# Patient Record
Sex: Female | Born: 1950 | Race: White | Hispanic: No | Smoking: Current every day smoker
Health system: Southern US, Community
[De-identification: ages and names within clinical notes are randomized; demographics above are authoritative.]

## PROBLEM LIST (undated history)

## (undated) DIAGNOSIS — I10 Essential (primary) hypertension: Secondary | ICD-10-CM

## (undated) DIAGNOSIS — I82409 Acute embolism and thrombosis of unspecified deep veins of unspecified lower extremity: Secondary | ICD-10-CM

## (undated) DIAGNOSIS — C801 Malignant (primary) neoplasm, unspecified: Secondary | ICD-10-CM

## (undated) DIAGNOSIS — E7800 Pure hypercholesterolemia, unspecified: Secondary | ICD-10-CM

## (undated) HISTORY — PX: ABDOMINAL HYSTERECTOMY: SHX81

## (undated) HISTORY — PX: TUBAL LIGATION: SHX77

## (undated) HISTORY — PX: OTHER SURGICAL HISTORY: SHX169

## (undated) HISTORY — PX: CYSTOSCOPY: SUR368

## (undated) HISTORY — PX: NECK SURGERY: SHX720

## (undated) HISTORY — PX: LOBECTOMY: SHX5089

## (undated) HISTORY — PX: HEMORRHOID SURGERY: SHX153

## (undated) HISTORY — PX: KNEE SURGERY: SHX244

---

## 2009-08-03 ENCOUNTER — Emergency Department (HOSPITAL_COMMUNITY): Admission: EM | Admit: 2009-08-03 | Discharge: 2009-08-03 | Payer: Self-pay | Admitting: Emergency Medicine

## 2010-10-05 LAB — DIFFERENTIAL
Basophils Absolute: 0.1 10*3/uL (ref 0.0–0.1)
Eosinophils Relative: 2 % (ref 0–5)
Lymphocytes Relative: 36 % (ref 12–46)
Lymphs Abs: 3.6 10*3/uL (ref 0.7–4.0)
Monocytes Absolute: 0.9 10*3/uL (ref 0.1–1.0)
Neutro Abs: 5.3 10*3/uL (ref 1.7–7.7)

## 2010-10-05 LAB — BASIC METABOLIC PANEL
BUN: 13 mg/dL (ref 6–23)
Calcium: 9 mg/dL (ref 8.4–10.5)
Creatinine, Ser: 1.03 mg/dL (ref 0.4–1.2)
GFR calc non Af Amer: 55 mL/min — ABNORMAL LOW (ref >60)
Glucose, Bld: 97 mg/dL (ref 70–99)
Potassium: 3.7 mEq/L (ref 3.5–5.1)
Sodium: 137 mEq/L (ref 135–145)

## 2010-10-05 LAB — CBC
Hemoglobin: 14.3 g/dL (ref 12.0–15.0)
MCHC: 33.8 g/dL (ref 30.0–36.0)
RBC: 4.07 MIL/uL (ref 3.87–5.11)
RDW: 12.5 % (ref 11.5–15.5)
WBC: 10 10*3/uL (ref 4.0–10.5)

## 2010-11-04 IMAGING — CR DG CHEST 2V
2 series · 2 of 2 positions shown · non-contrast
Comparison: None.

CLINICAL DATA: Cough.  Left-sided chest pain.

CHEST - 2 VIEW

[view not recorded (1 of 2)]
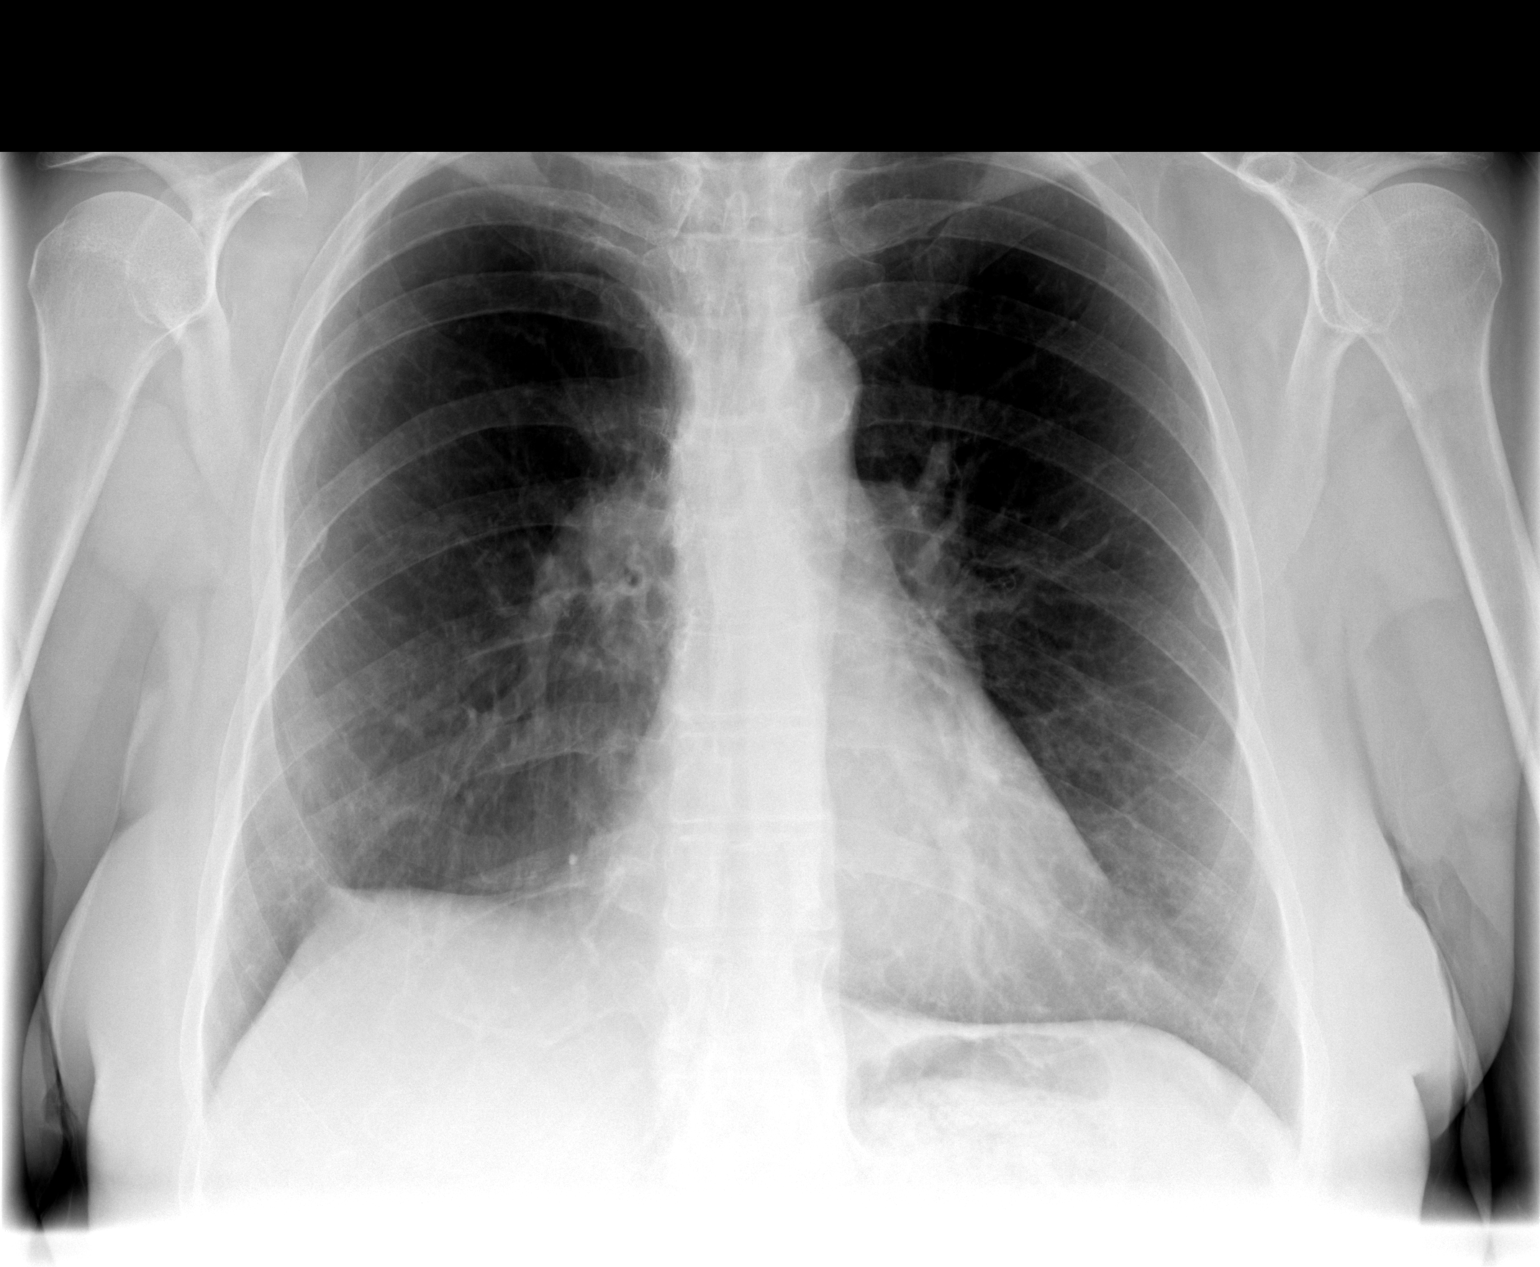

[view not recorded (2 of 2)]
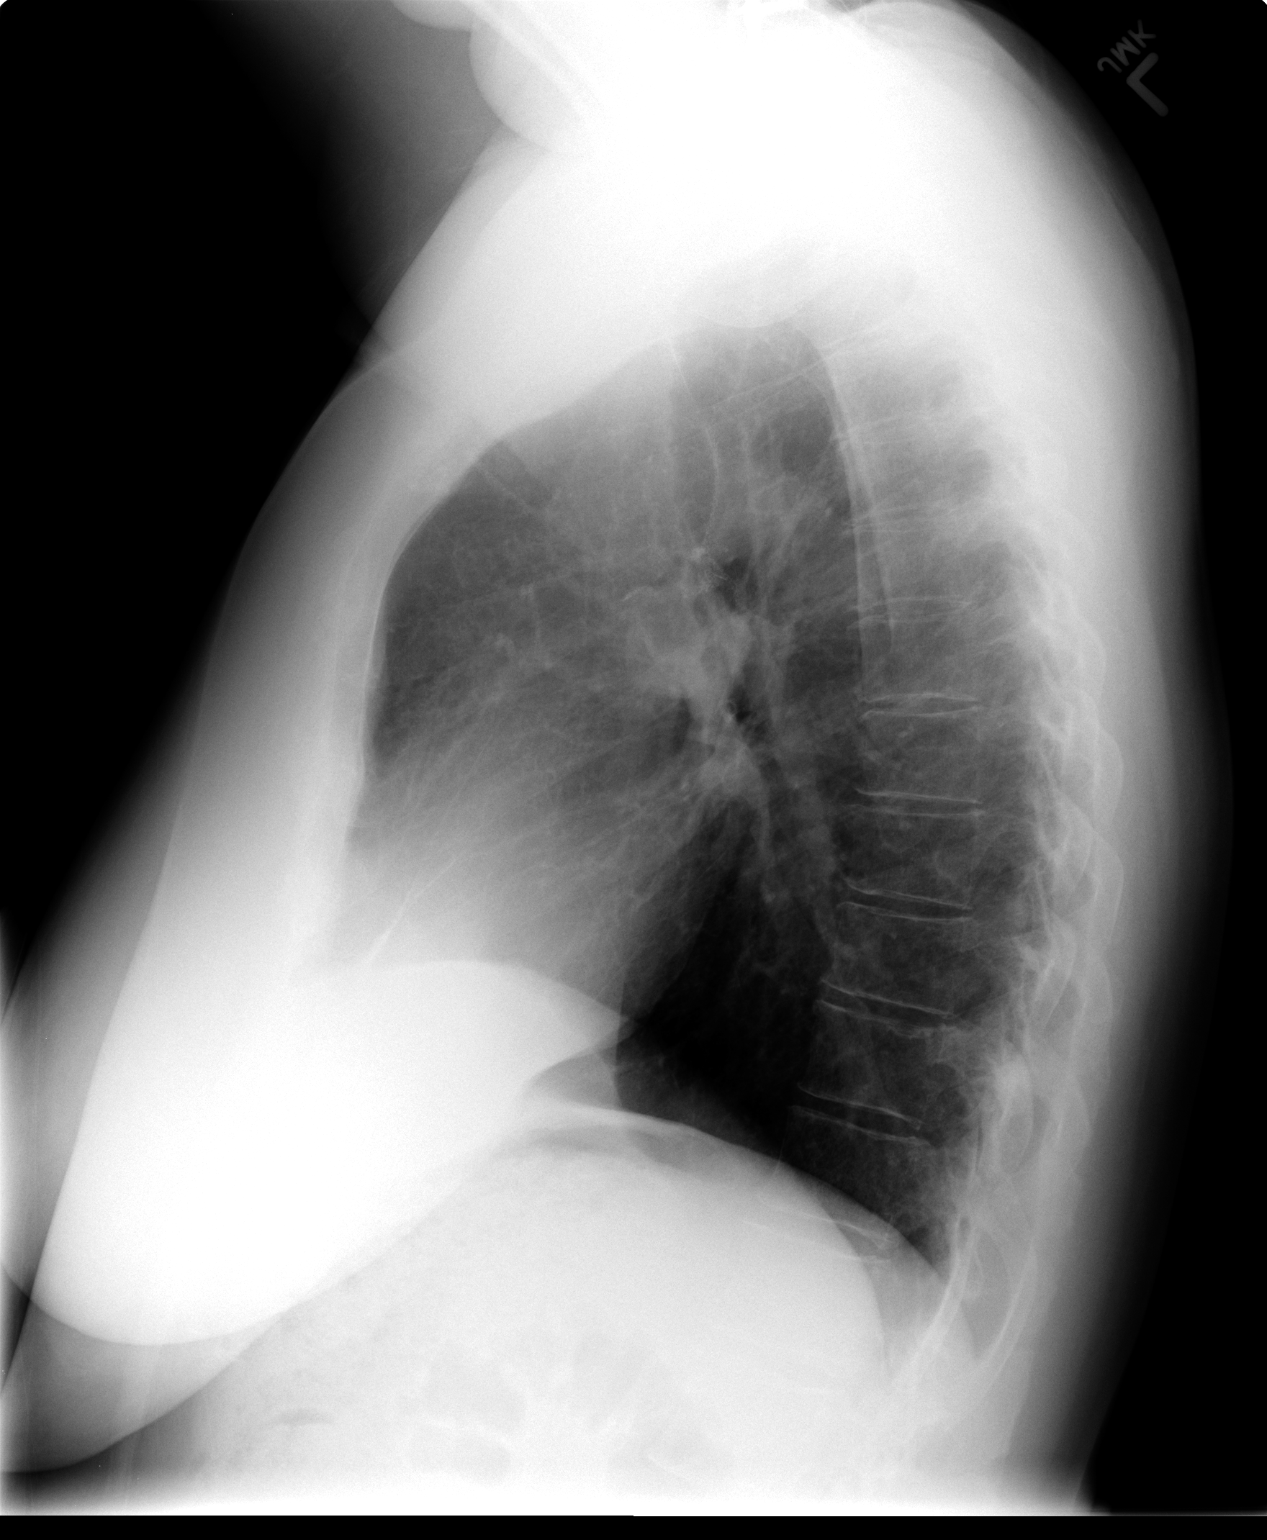

[2 of 2 positions shown; findings below may reference images not displayed]

FINDINGS: Abnormal chest with fullness right hilum and elevated
right hemidiaphragm.  Malignancy cannot be excluded.  Chest CT
recommended.  No segmental consolidation.  Calcified aorta.  No
pulmonary edema or pneumothorax.
IMPRESSION: Abnormal chest x-ray examination.  CT the chest recommended to
exclude malignancy.

Critical test results telephoned to Dr. Jhoel at the time of
interpretation on 08/03/2009 at [DATE] p.m.

## 2013-07-30 ENCOUNTER — Emergency Department (HOSPITAL_COMMUNITY): Payer: Medicare Other

## 2013-07-30 ENCOUNTER — Emergency Department (HOSPITAL_COMMUNITY)
Admission: EM | Admit: 2013-07-30 | Discharge: 2013-07-30 | Disposition: A | Discharge status: Home / Self Care | Payer: Medicare Other | Attending: Emergency Medicine | Admitting: Emergency Medicine

## 2013-07-30 ENCOUNTER — Encounter (HOSPITAL_COMMUNITY): Payer: Self-pay | Admitting: Emergency Medicine

## 2013-07-30 DIAGNOSIS — Z8639 Personal history of other endocrine, nutritional and metabolic disease: Secondary | ICD-10-CM | POA: Insufficient documentation

## 2013-07-30 DIAGNOSIS — I10 Essential (primary) hypertension: Secondary | ICD-10-CM | POA: Insufficient documentation

## 2013-07-30 DIAGNOSIS — S8253XA Displaced fracture of medial malleolus of unspecified tibia, initial encounter for closed fracture: Secondary | ICD-10-CM | POA: Insufficient documentation

## 2013-07-30 DIAGNOSIS — Z880 Allergy status to penicillin: Secondary | ICD-10-CM | POA: Insufficient documentation

## 2013-07-30 DIAGNOSIS — Z85118 Personal history of other malignant neoplasm of bronchus and lung: Secondary | ICD-10-CM | POA: Insufficient documentation

## 2013-07-30 DIAGNOSIS — R296 Repeated falls: Secondary | ICD-10-CM | POA: Insufficient documentation

## 2013-07-30 DIAGNOSIS — Y929 Unspecified place or not applicable: Secondary | ICD-10-CM | POA: Insufficient documentation

## 2013-07-30 DIAGNOSIS — Z9889 Other specified postprocedural states: Secondary | ICD-10-CM | POA: Insufficient documentation

## 2013-07-30 DIAGNOSIS — Z862 Personal history of diseases of the blood and blood-forming organs and certain disorders involving the immune mechanism: Secondary | ICD-10-CM | POA: Insufficient documentation

## 2013-07-30 DIAGNOSIS — Y9389 Activity, other specified: Secondary | ICD-10-CM | POA: Insufficient documentation

## 2013-07-30 DIAGNOSIS — S82402A Unspecified fracture of shaft of left fibula, initial encounter for closed fracture: Secondary | ICD-10-CM

## 2013-07-30 DIAGNOSIS — F17200 Nicotine dependence, unspecified, uncomplicated: Secondary | ICD-10-CM | POA: Insufficient documentation

## 2013-07-30 DIAGNOSIS — R21 Rash and other nonspecific skin eruption: Secondary | ICD-10-CM | POA: Insufficient documentation

## 2013-07-30 DIAGNOSIS — Z86718 Personal history of other venous thrombosis and embolism: Secondary | ICD-10-CM | POA: Insufficient documentation

## 2013-07-30 DIAGNOSIS — Z8541 Personal history of malignant neoplasm of cervix uteri: Secondary | ICD-10-CM | POA: Insufficient documentation

## 2013-07-30 HISTORY — DX: Malignant (primary) neoplasm, unspecified: C80.1

## 2013-07-30 HISTORY — DX: Essential (primary) hypertension: I10

## 2013-07-30 HISTORY — DX: Pure hypercholesterolemia, unspecified: E78.00

## 2013-07-30 HISTORY — DX: Acute embolism and thrombosis of unspecified deep veins of unspecified lower extremity: I82.409

## 2013-07-30 MED ORDER — ONDANSETRON HCL 4 MG PO TABS
4.0000 mg | ORAL_TABLET | Freq: Once | ORAL | Status: AC
  Administered 2013-07-30: 4 mg via ORAL
  Filled 2013-07-30: qty 1

## 2013-07-30 MED ORDER — IBUPROFEN 800 MG PO TABS
800.0000 mg | ORAL_TABLET | Freq: Once | ORAL | Status: AC
  Administered 2013-07-30: 800 mg via ORAL
  Filled 2013-07-30: qty 1

## 2013-07-30 MED ORDER — HYDROCODONE-ACETAMINOPHEN 5-325 MG PO TABS
2.0000 | ORAL_TABLET | Freq: Once | ORAL | Status: AC
  Administered 2013-07-30: 2 via ORAL
  Filled 2013-07-30: qty 2

## 2013-07-30 NOTE — ED Notes (Signed)
Patient with no complaints at this time. Respirations even and unlabored. Skin warm/dry. Discharge instructions reviewed with patient at this time. Patient given opportunity to voice concerns/ask questions. Patient discharged at this time and left Emergency Department with steady gait.   

## 2013-07-30 NOTE — ED Provider Notes (Signed)
Medical screening examination/treatment/procedure(s) were conducted as a shared visit with non-physician practitioner(s) and myself.  I personally evaluated the patient during the encounter.  EKG Interpretation   None        Pt stable, distal pulses intact, fracture noted, advised to use crutches, stable for d/c  Sharyon Cable, MD 07/30/13 1228

## 2013-07-30 NOTE — ED Notes (Signed)
Pt reports fell 2 weeks ago and hurt left ankle.  Pt's ankle and lower leg bruised and swollen.  Reports swelling is worse at night.  Pt has area of redness to lower leg and c/o burning and stinging sensation.

## 2013-07-30 NOTE — ED Provider Notes (Signed)
CSN: [id number]     Arrival date & time 07/30/13  9518 History   First MD Initiated Contact with Patient 07/30/13 0932     Chief Complaint  Patient presents with  . Ankle Pain   (Consider location/radiation/quality/duration/timing/severity/associated sxs/prior Treatment) HPI Comments: Patient is a 63 year old female who sustained a fall from a standing position approximately 2 weeks ago. The patient states she has had pain in her left ankle since that time. The patient states that she suffers from psoriasis particularly of her feet, she has pain frequently, but this pain is getting progressively worse and seems to be radiating up the leg from her left ankle. She's also noticed some bruising in the area. The patient states that she had increased pain on last evening and decided to come to the emergency department for additional evaluation. It is of note that the patient is visiting from the [place], [place] area. She has tried conservative management and this has not been successful.  Patient is a 63 y.o. female presenting with ankle pain. The history is provided by the patient.  Ankle Pain Associated symptoms: no back pain and no neck pain     Past Medical History  Diagnosis Date  . Hypertension   . Hypercholesterolemia   . Cancer     lung and cervical cancer  . DVT (deep venous thrombosis)    Past Surgical History  Procedure Laterality Date  . Abdominal hysterectomy    . Cystoscopy    . Vaginal wall sling    . Knee surgery    . Neck surgery    . Lobectomy    . Tubal ligation    . Hemorrhoid surgery    . Blood clot removed     No family history on file. History  Substance Use Topics  . Smoking status: Current Every Day Smoker  . Smokeless tobacco: Not on file  . Alcohol Use: No   OB History   Grav Para Term Preterm Abortions TAB SAB Ect Mult Living                 Review of Systems  Constitutional: Negative for activity change.       All ROS Neg except as noted  in HPI  HENT: Negative for nosebleeds.   Eyes: Negative for photophobia and discharge.  Respiratory: Negative for cough, shortness of breath and wheezing.   Cardiovascular: Negative for chest pain and palpitations.  Gastrointestinal: Negative for abdominal pain and blood in stool.  Genitourinary: Negative for dysuria, frequency and hematuria.  Musculoskeletal: Positive for arthralgias. Negative for back pain and neck pain.  Skin: Positive for rash.  Neurological: Negative for dizziness, seizures and speech difficulty.  Psychiatric/Behavioral: Negative for hallucinations and confusion.    Allergies  Bactrim; Penicillins; and Tetracyclines & related  Home Medications  No current outpatient prescriptions on file. BP 109/83  Pulse 72  Temp(Src) 98.4 F (36.9 C) (Oral)  Resp 18  Ht 5\' 8"  (1.727 m)  Wt 220 lb (99.791 kg)  BMI 33.46 kg/m2  SpO2 100% Physical Exam  Nursing note and vitals reviewed. Constitutional: She is oriented to person, place, and time. She appears well-developed and well-nourished.  Non-toxic appearance.  HENT:  Head: Normocephalic.  Right Ear: Tympanic membrane and external ear normal.  Left Ear: Tympanic membrane and external ear normal.  Eyes: EOM and lids are normal. Pupils are equal, round, and reactive to light.  Neck: Normal range of motion. Neck supple. Carotid bruit is not present.  Cardiovascular: Normal rate, regular rhythm, normal heart sounds, intact distal pulses and normal pulses.   Pulmonary/Chest: Breath sounds normal. No respiratory distress.  Abdominal: Soft. Bowel sounds are normal. There is no tenderness. There is no guarding.  Musculoskeletal: Normal range of motion.  There is pain to palpation and movement of the medial malleolus area. This pain extends to the mid calf area. The Achilles tendon is intact.  Lymphadenopathy:       Head (right side): No submandibular adenopathy present.       Head (left side): No submandibular adenopathy  present.    She has no cervical adenopathy.  Neurological: She is alert and oriented to person, place, and time. She has normal strength. No cranial nerve deficit or sensory deficit.  Skin: Skin is warm and dry.  Psychiatric: She has a normal mood and affect. Her speech is normal.    ED Course  Procedures (including critical care time) Labs Review Labs Reviewed - No data to display Imaging Review Dg Ankle Complete Left  07/30/2013   CLINICAL DATA:  Fall with left ankle pain.  EXAM: LEFT ANKLE COMPLETE - 3+ VIEW  COMPARISON:  None.  FINDINGS: There may be a subtle nondisplaced fracture involving the tip of the medial malleolus. The ankle mortise shows normal alignment. No other bony abnormalities are seen.  IMPRESSION: Possible subtle nondisplaced fracture involving the medial malleolus.   Electronically Signed   By: Aletta Edouard M.D.   On: 07/30/2013 09:56    EKG Interpretation   None       MDM  No diagnosis found. **I have reviewed nursing notes, vital signs, and all appropriate lab and imaging results for this patient.*  X-ray of the left ankle reveals a subtle nondisplaced fracture involving the medial malleolus. The ankle mortise shows normal alignment.  Patient fitted with a posterior splint and crutches. Patient has an orthopedist in the [facility] area. Patient given a copy of her x-ray to take with her to the orthopedic specialist. Patient is currently on oxycodone 15. Patient invited to continue her current medications. She is to return to the emergency department if any problems before her orthopedic evaluation.  Lenox Ahr, PA-C 07/30/13 1141

## 2013-07-30 NOTE — ED Notes (Signed)
Pt also reports is on antibiotics for staph infection in sinuses.   Pt has palpable pedal pulse in left foot.  Foot warm and dry.

## 2013-07-30 NOTE — Discharge Instructions (Signed)
The fibula (small ankle bone) is broken. Please keep foot/leg elevated above your waist as much as possible. Please see your orthopedic MD as soon as possible. Use crutches. Please do not get the splint wet. Fibular Fracture, Ankle, Adult, Treated With or Without Immobilization A fibular fracture at your ankle is a break (fracture) bone in the smallest of the two bones in your lower leg, located on the outside of your leg (fibula) close to the area at your ankle joint. CAUSES  Rolling your ankle.  Twisting your ankle.  Extreme flexing or extending of your foot.  Severe force on your ankle as when falling from a distance. RISK FACTORS  Jumping activities.  Participation in sports.  Osteoporosis.  Advanced age.  Previous ankle injuries. SIGNS AND SYMPTOMS  Pain.  Swelling.  Inability to put weight on injured ankle.  Bruising.  Bone deformities at site of injury. DIAGNOSIS  This fracture is diagnosed with the help of an X-ray exam. TREATMENT  If the fractured bone did not move out of place it usually will heal without problems and does casting or splinting. If immobilization is needed for comfort or the fractured bone moved out of place and will not heal properly with immobilization, a cast or splint will be used. HOME CARE INSTRUCTIONS   Apply ice to the area of injury:  Put ice in a plastic bag.  Place a towel between your skin and the bag.  Leave the ice on for 20 minutes, 2 3 times a day.  Use crutches as directed. Resume walking without crutches as directed by your health care provider.  Only take over-the-counter or prescription medicines for pain, discomfort, or fever as directed by your health care provider.  If you have a removable splint or boot, do not remove the boot unless directed by your health care provider. SEEK MEDICAL CARE IF:   You have continued pain or more swelling  The medications do not control the pain. SEEK IMMEDIATE MEDICAL CARE  IF:  You develop severe pain in the leg or foot.  Your skin or nails below the injury turn blue or grey or feel cold or numb. MAKE SURE YOU:   Understand these instructions.  Will watch your condition.  Will get help right away if you are not doing well or get worse. Document Released: 07/06/2005 Document Revised: 04/26/2013 Document Reviewed: 02/15/2013 [facility] Patient Information 2014 [place].

## 2014-10-31 IMAGING — CR DG ANKLE COMPLETE 3+V*L*
3 series · 3 of 3 positions shown · non-contrast
Comparison: None.

CLINICAL DATA: Fall with left ankle pain.

EXAM:
LEFT ANKLE COMPLETE - 3+ VIEW

[view not recorded (1 of 3)]
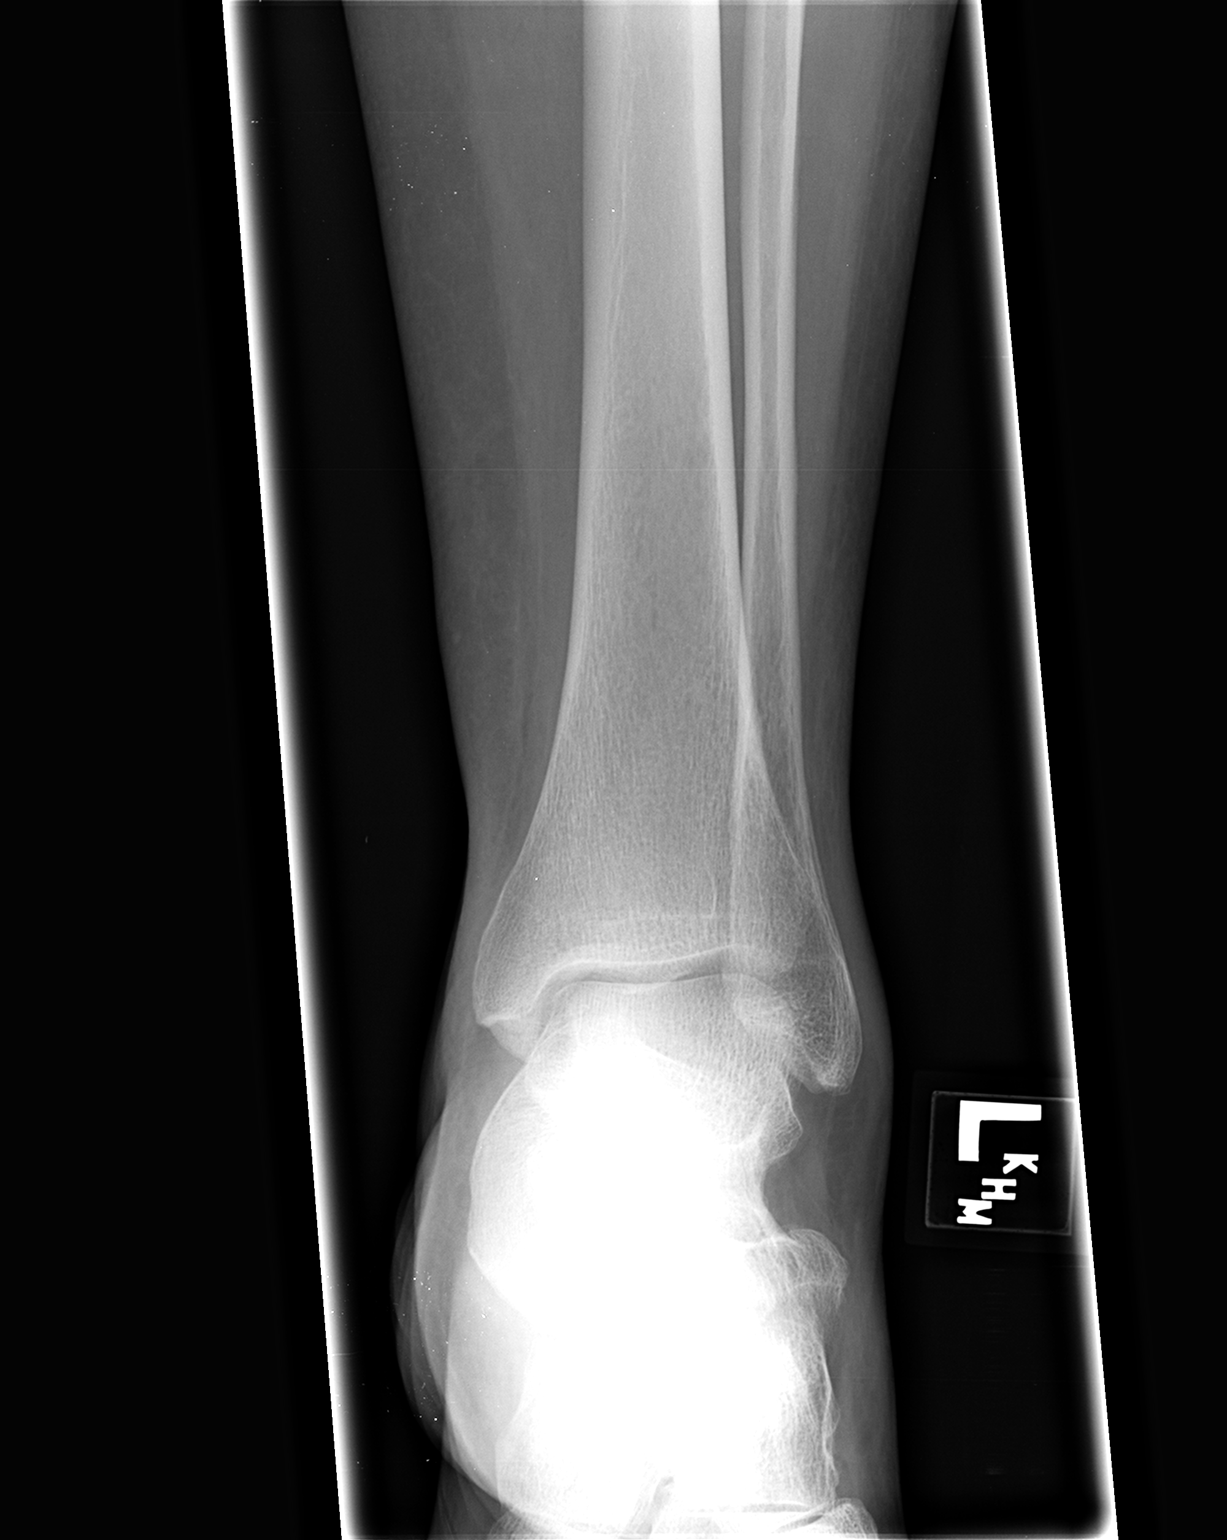

[view not recorded (2 of 3)]
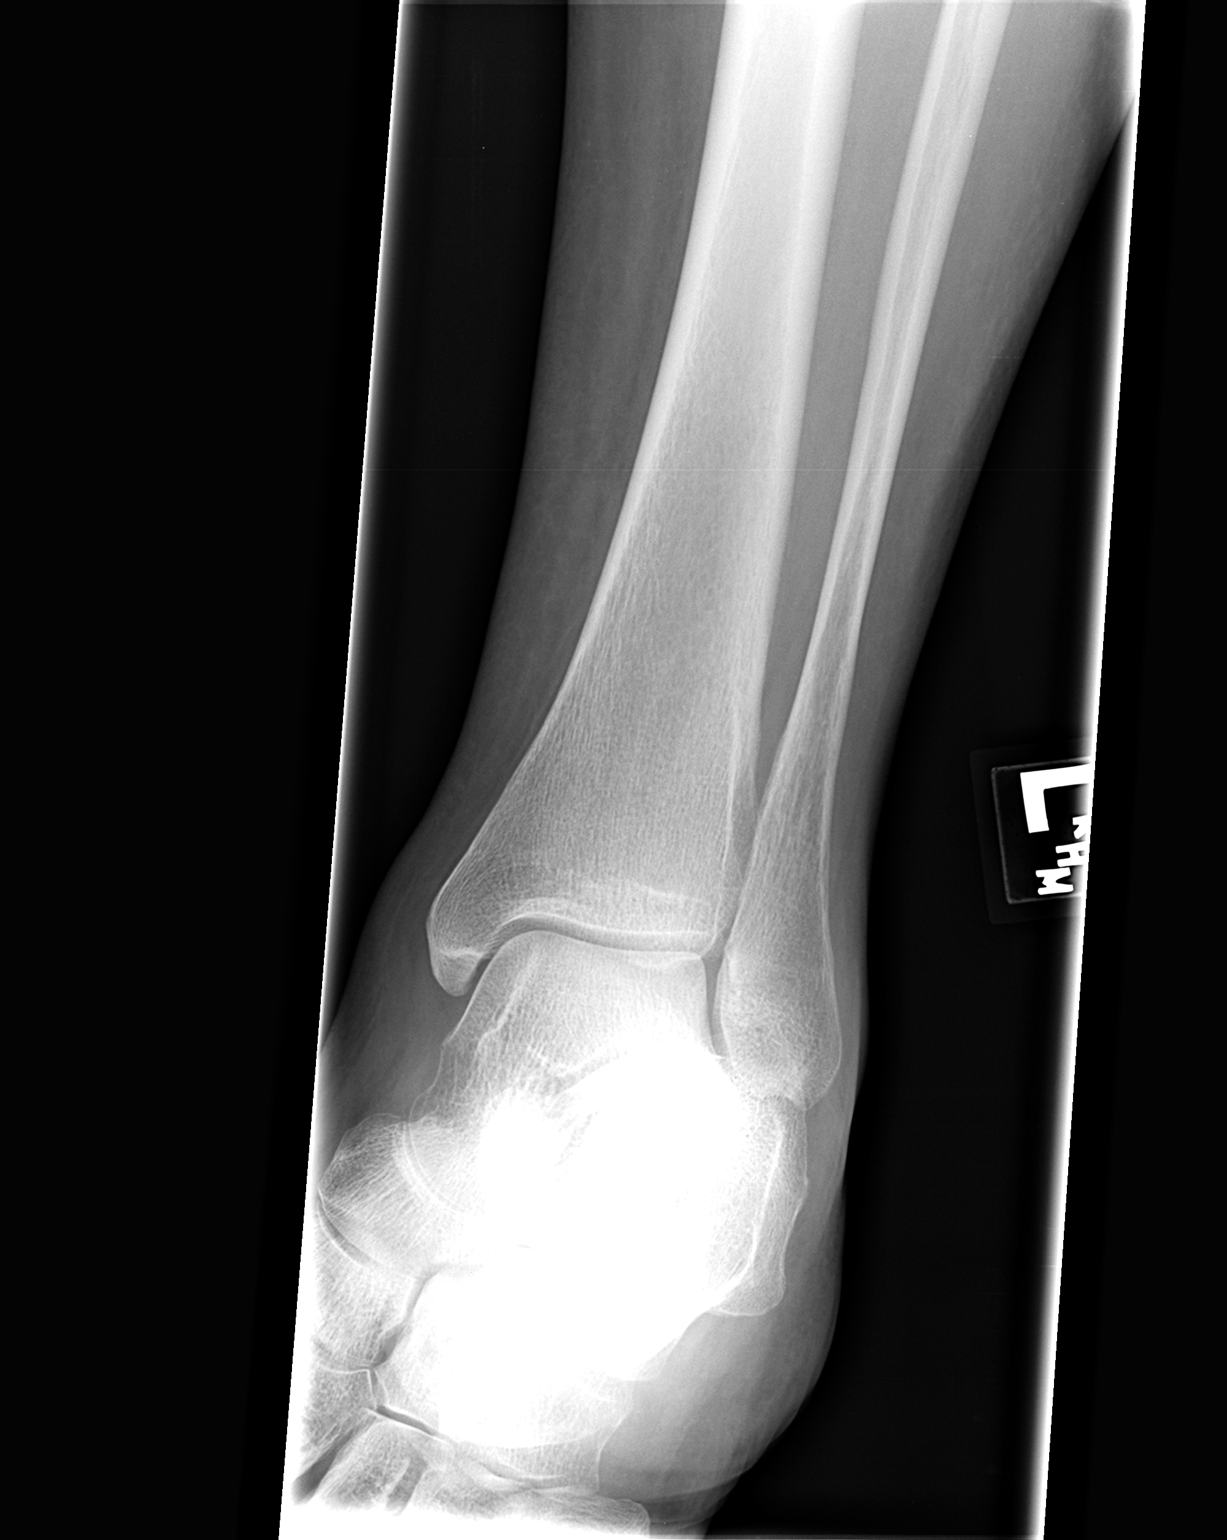

[view not recorded (3 of 3)]
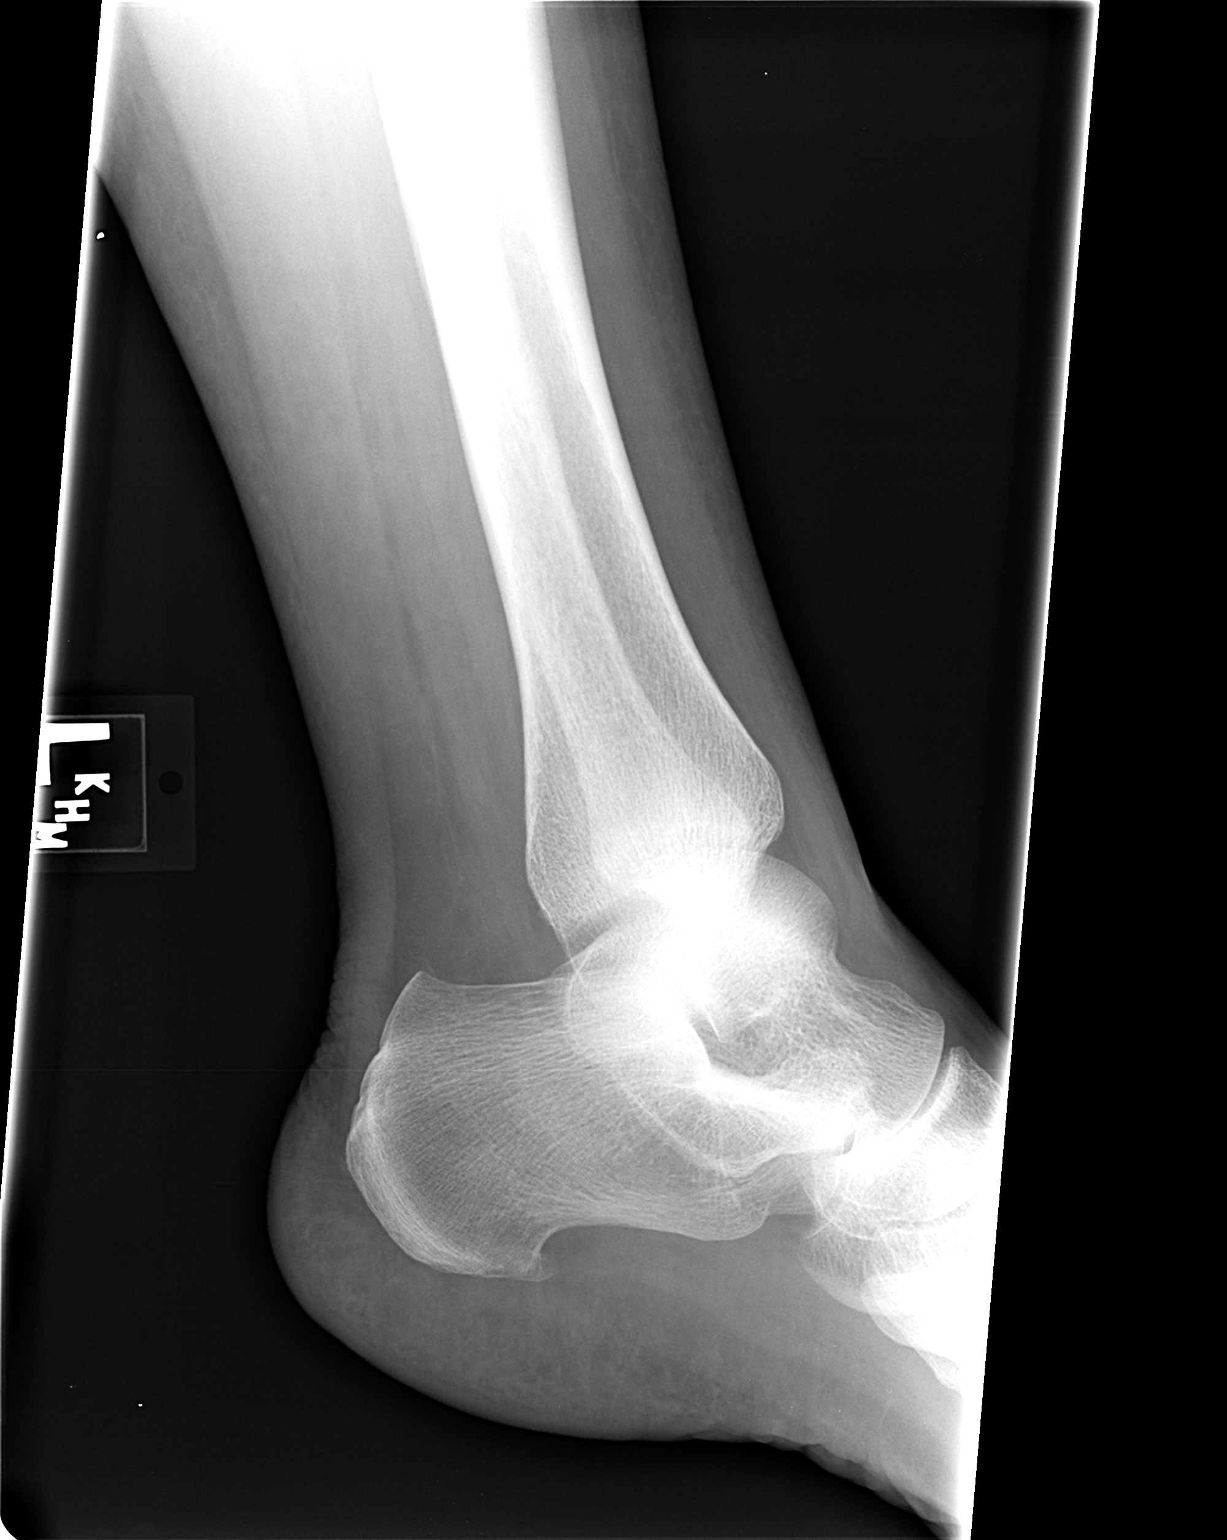

[3 of 3 positions shown; findings below may reference images not displayed]

FINDINGS: There may be a subtle nondisplaced fracture involving the tip of the
medial malleolus. The ankle mortise shows normal alignment. No other
bony abnormalities are seen.
IMPRESSION: Possible subtle nondisplaced fracture involving the medial
malleolus.
